# Patient Record
Sex: Male | Born: 1985
Health system: Midwestern US, Community
[De-identification: ages and names within clinical notes are randomized; demographics above are authoritative.]

---

## 2018-07-02 IMAGING — US US GALLBLADDER
1 series · 7 of 7 positions shown · non-contrast
Comparison: None

HISTORY: Epigastric pain
TECHNIQUE: Gallbladder ultrasound.

[Series 1: us gallbladder · 7 of 7 slices shown]
[im 1/7]
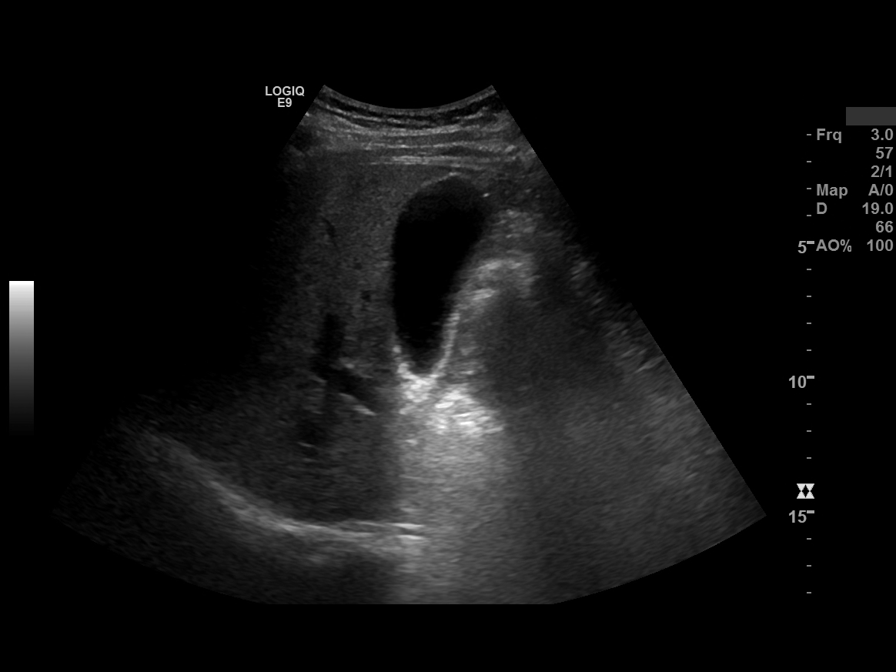
[im 2/7]
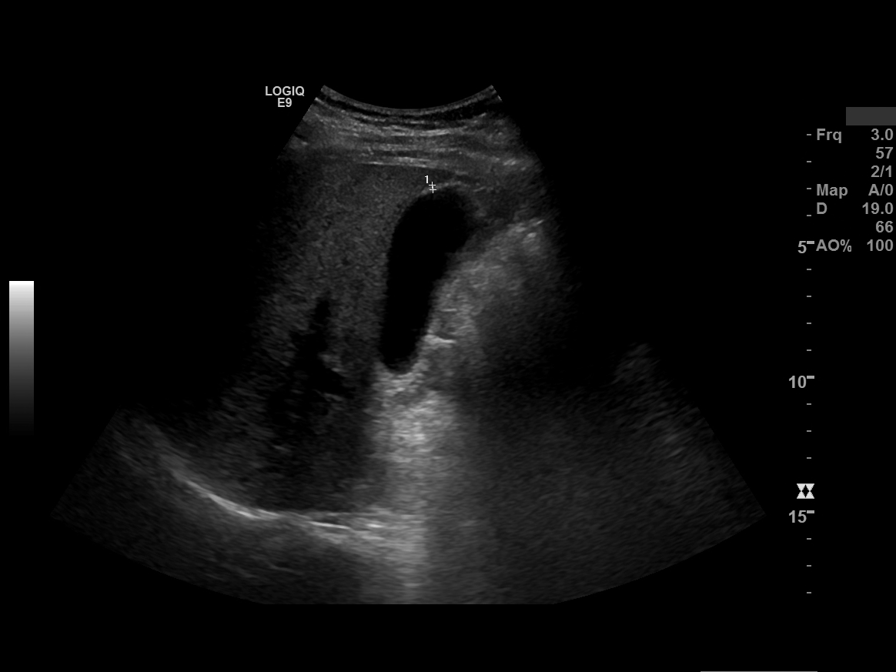
[im 3/7]
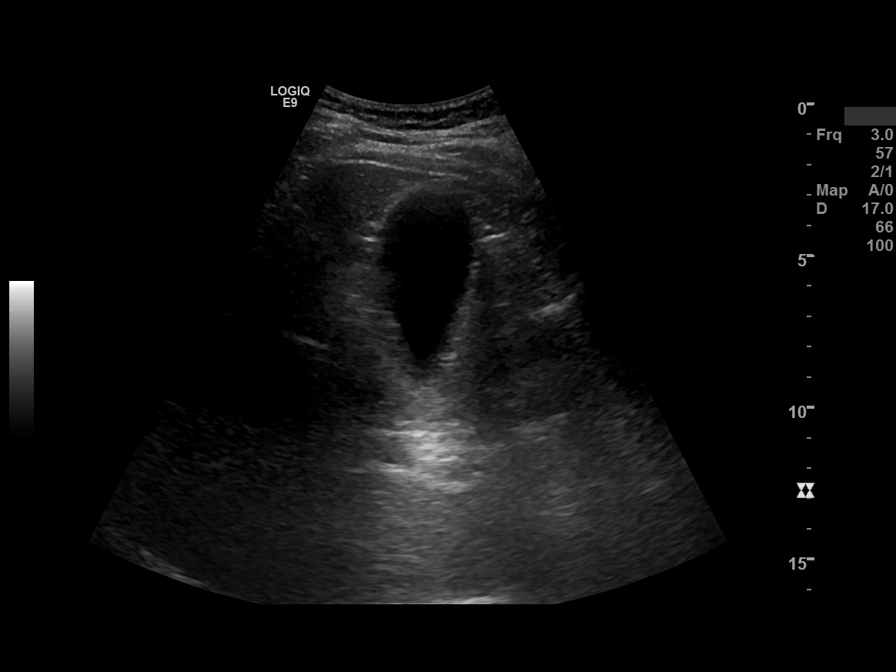
[im 4/7]
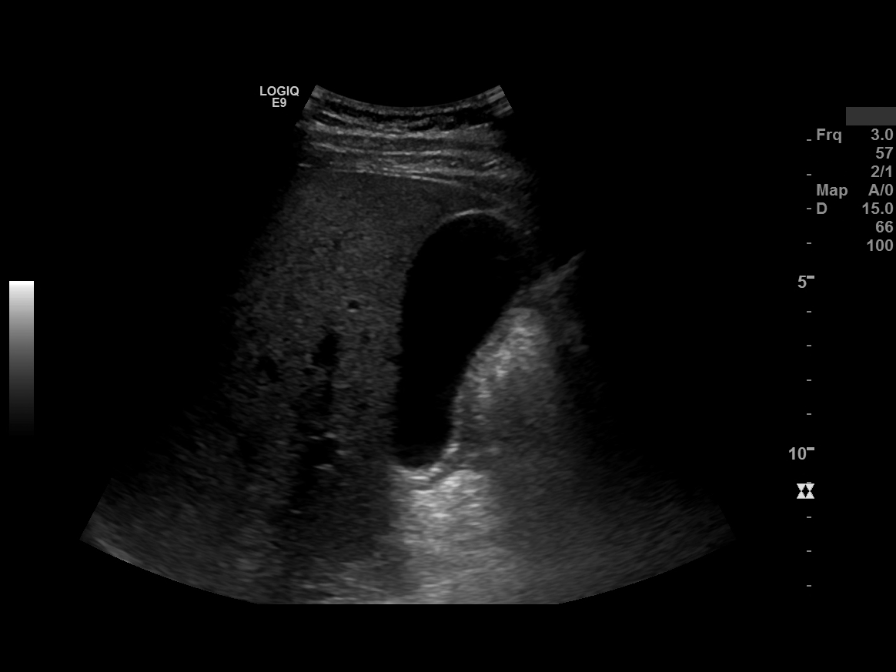
[im 5/7]
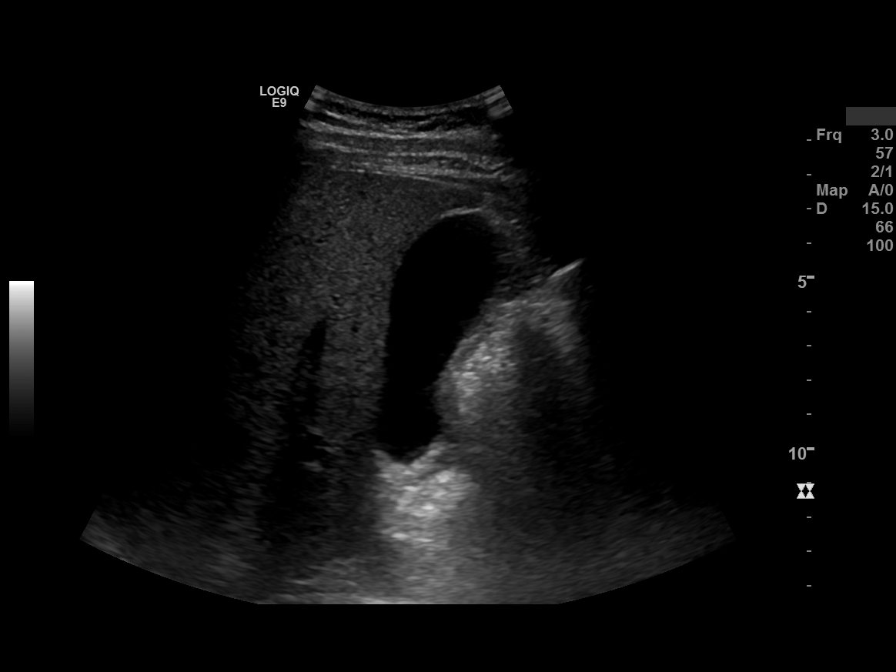
[im 6/7]
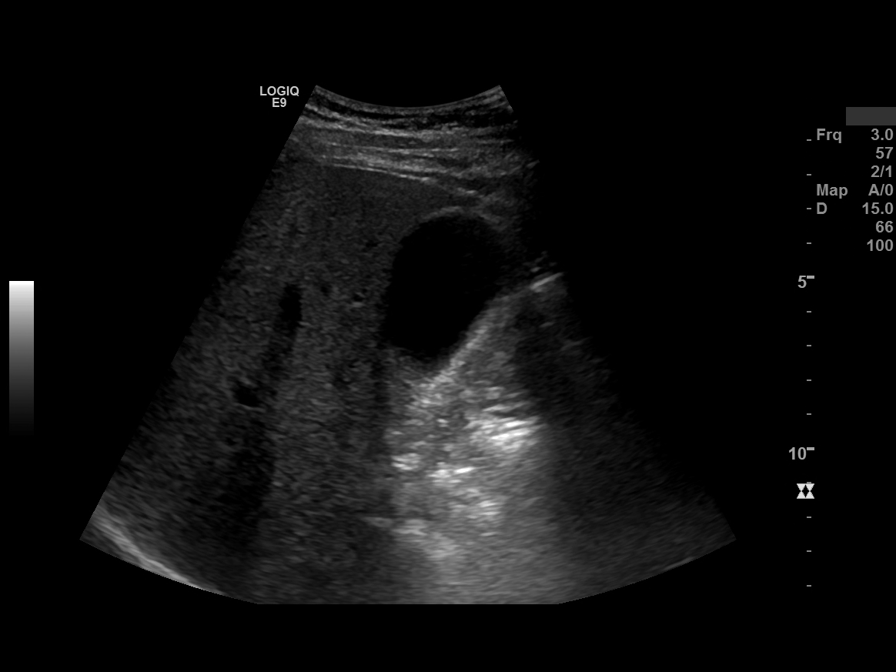
[im 7/7]
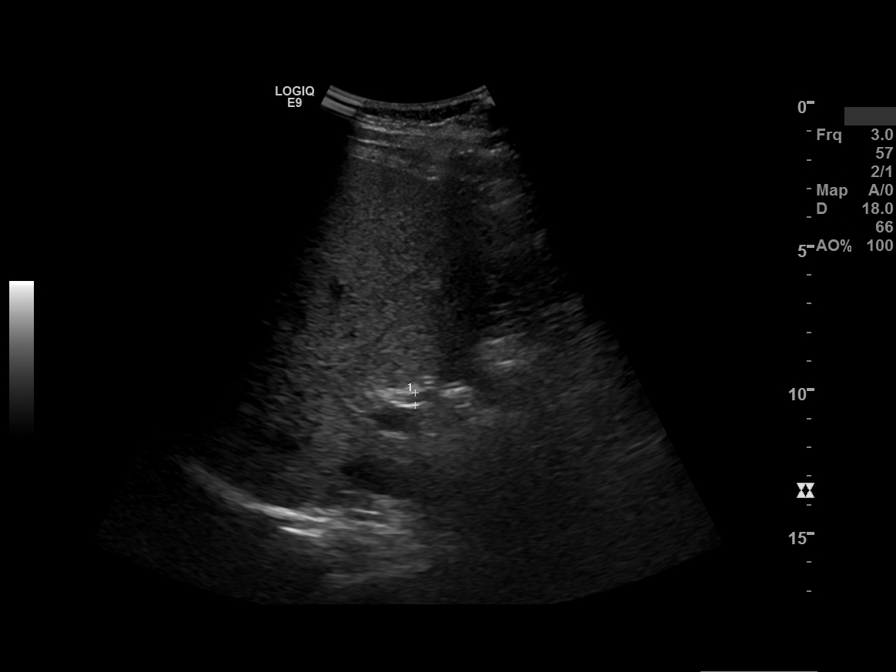

[7 of 7 positions shown; findings below may reference images not displayed]

FINDINGS: Multiple images from real-time examination of the gallbladder were performed.  The gallbladder is normal with no stones, sludge, wall thickening or pericholecystic fluid. Common bile duct is normal in size, measuring 4.2 mm. 

Visualized portions of the liver demonstrates coarsening of hepatic echotexture and increased echogenicity suggesting fatty infiltration.
IMPRESSION: Normal gallbladder ultrasound. Fatty infiltration of the liver.

## 2019-01-19 IMAGING — US US GALLBLADDER
1 series · 11 of 11 positions shown · non-contrast
Comparison: 07/02/2018

HISTORY: Acute pancreatitis
TECHNIQUE: Multiple sonographic images of the gallbladder.

[Series 1: us gallbladder · 11 of 11 slices shown]
[im 1/11]
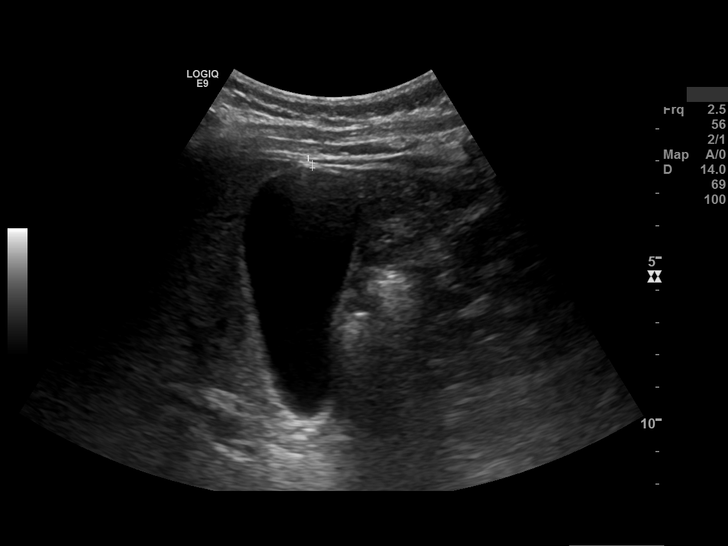
[im 2/11]
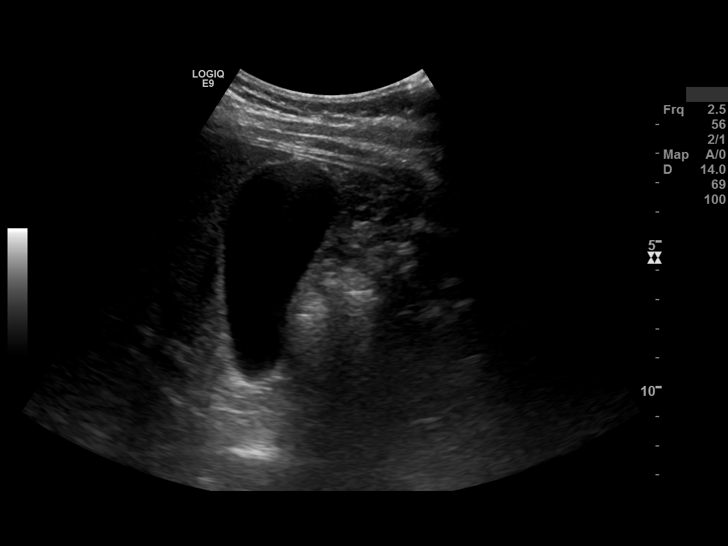
[im 3/11]
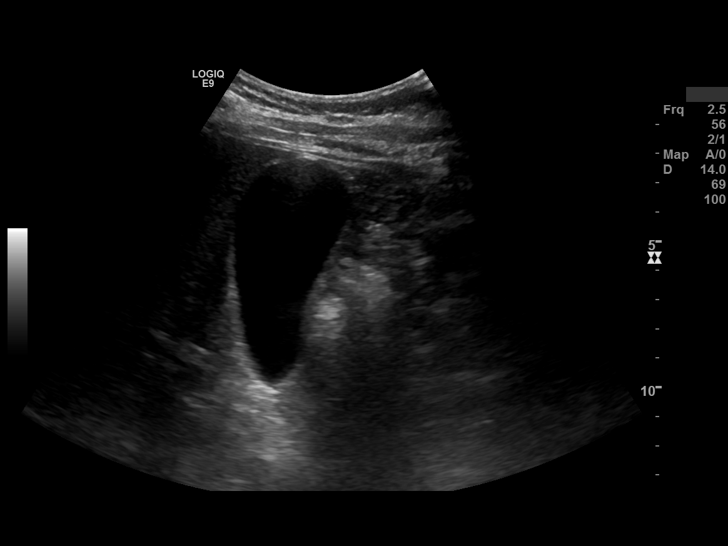
[im 4/11]
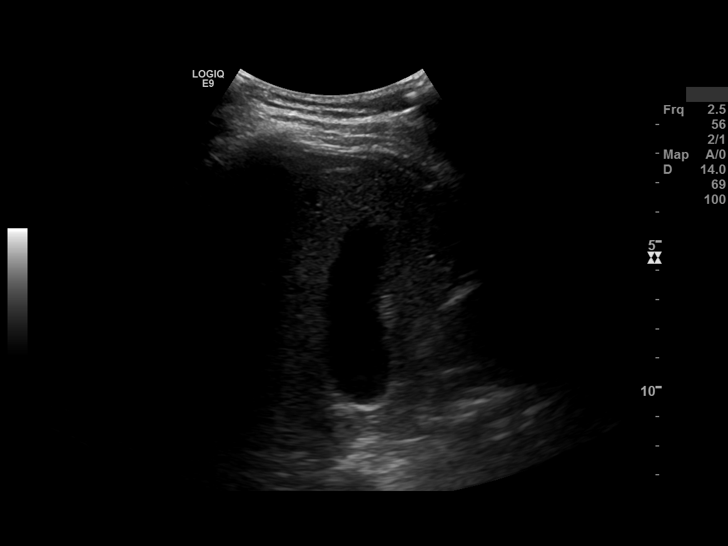
[im 5/11]
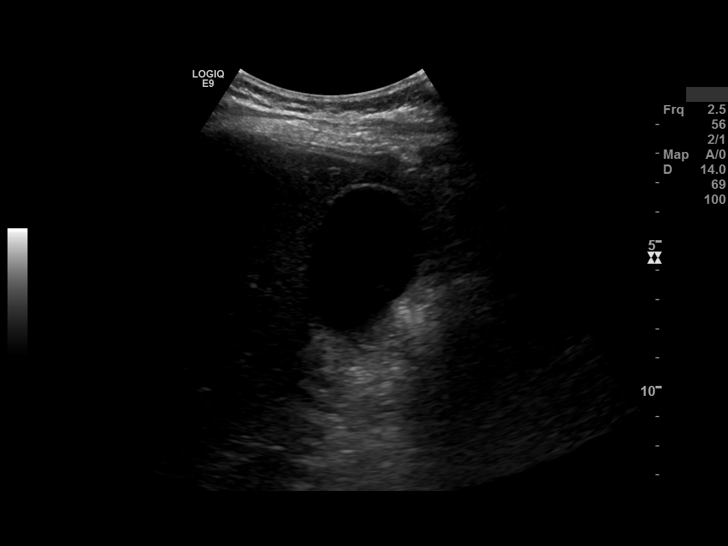
[im 6/11]
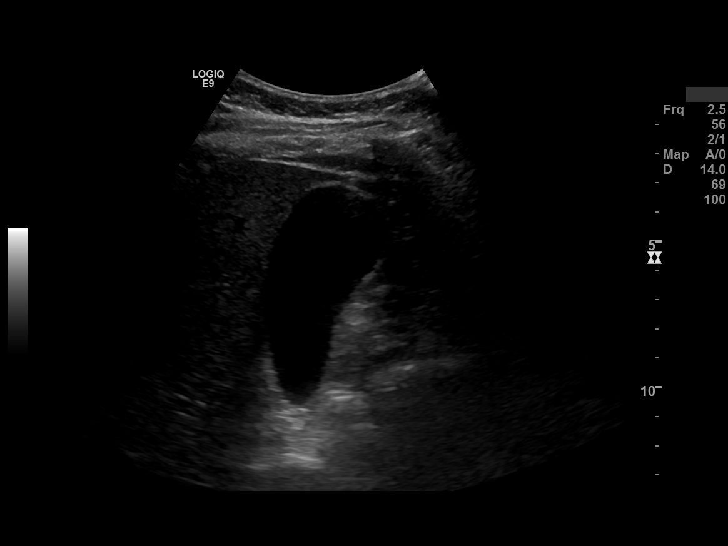
[im 7/11]
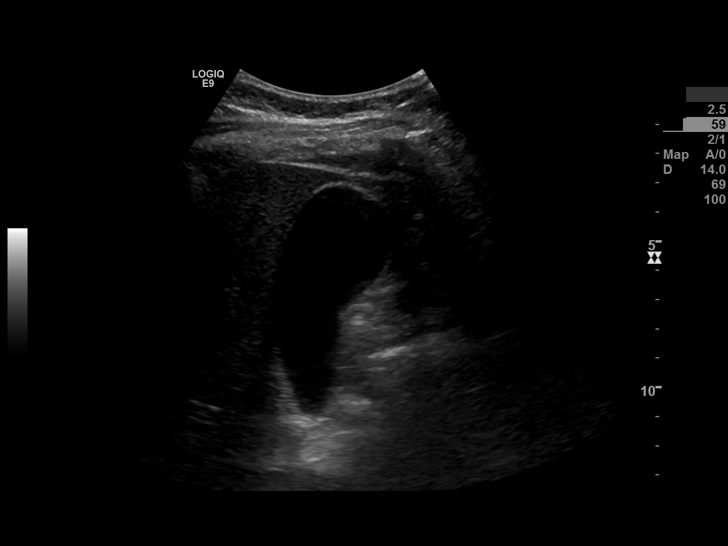
[im 8/11]
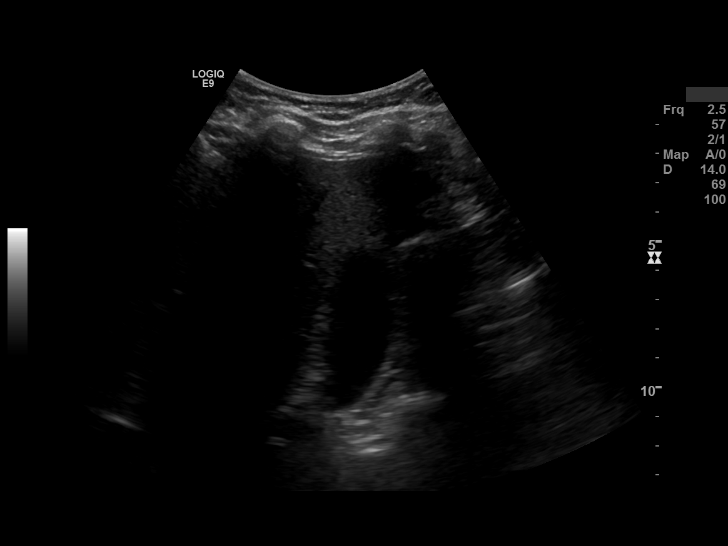
[im 9/11]
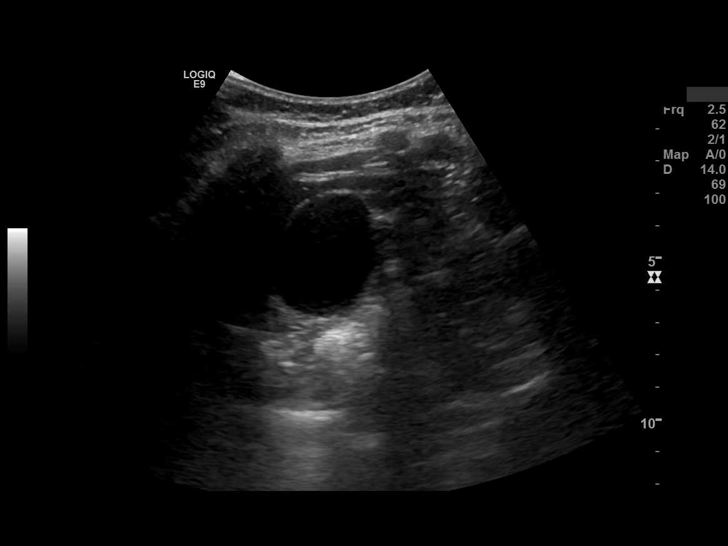
[im 10/11]
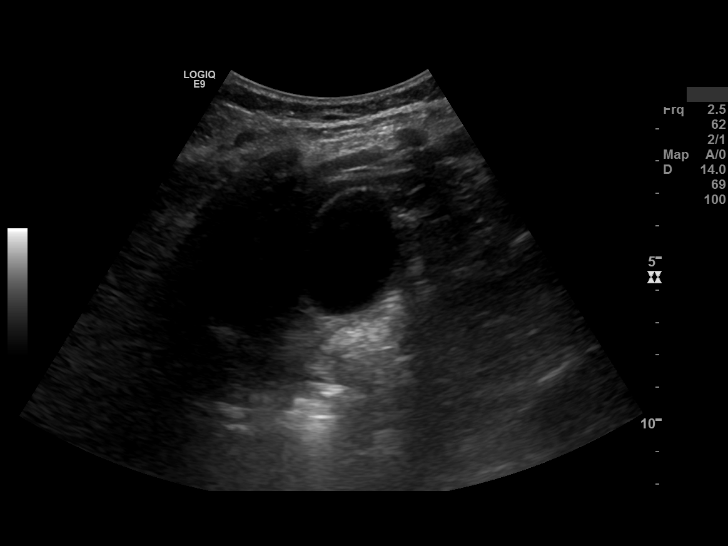
[im 11/11]
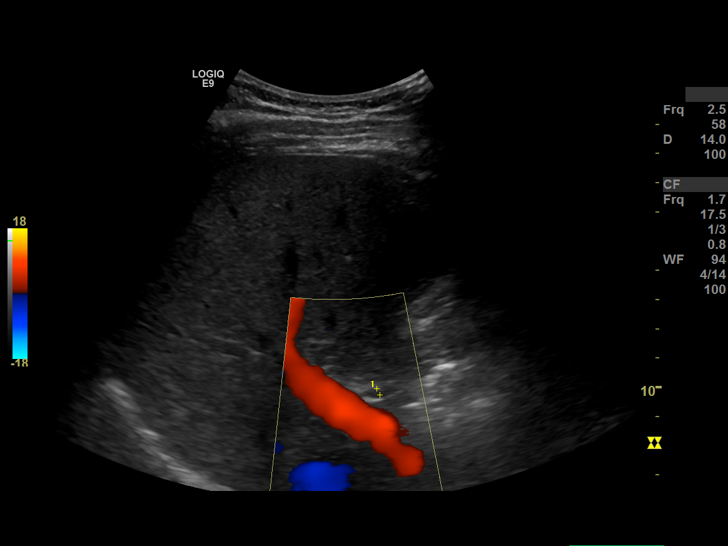

[11 of 11 positions shown; findings below may reference images not displayed]

FINDINGS: No gallbladder wall thickening, cholelithiasis, or pericholecystic fluid.  Common bile duct is within normal limits.
IMPRESSION: Normal gallbladder ultrasound.

## 2020-07-12 IMAGING — MR TECH MRI LSPINE WO CONTRAST
5 series · 48 of 48 positions shown · non-contrast
Comparison: none

HISTORY: Chronic lower back pain 

Tech exam only.

[Series 16: t2_sag · sagittal · 4.0mm · 0.88mm/px · 6 of 19 slices shown]
[im 1/19]
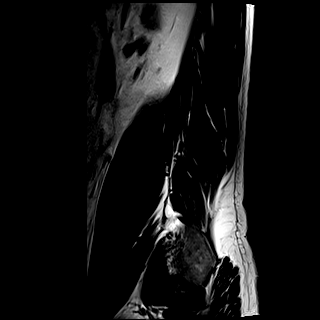
[im 4/19]
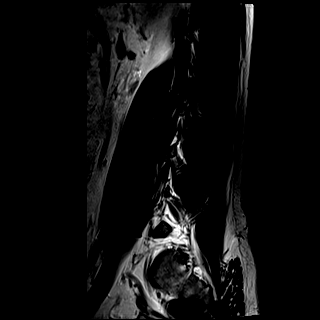
[im 8/19]
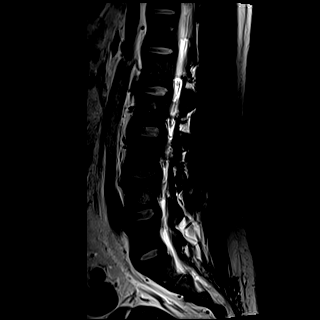
[im 11/19]
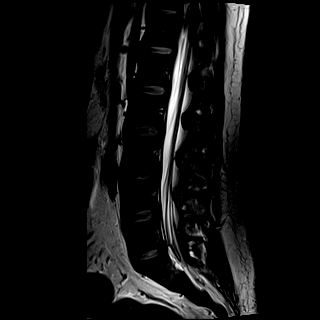
[im 15/19]
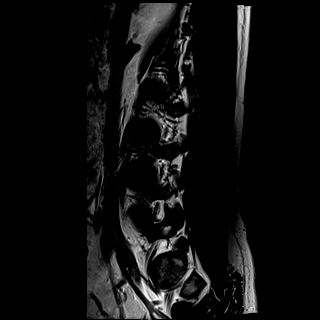
[im 19/19]
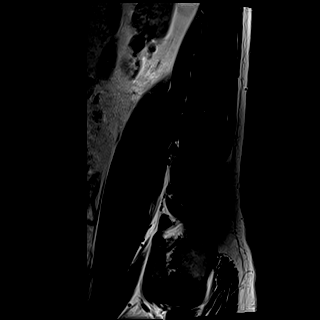

[Series 17: t1_sag · sagittal · 4.0mm · 0.88mm/px · 7 of 19 slices shown]
[im 1/19]
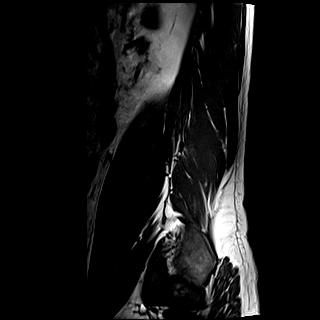
[im 4/19]
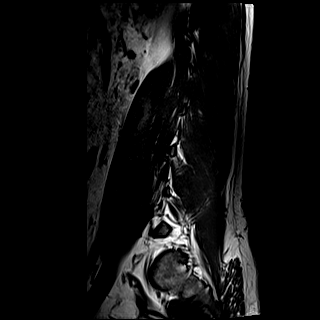
[im 7/19]
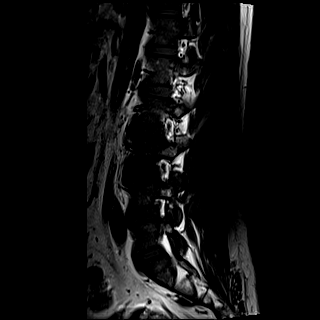
[im 10/19]
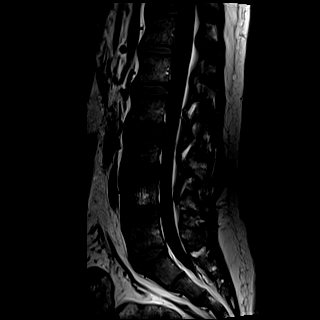
[im 13/19]
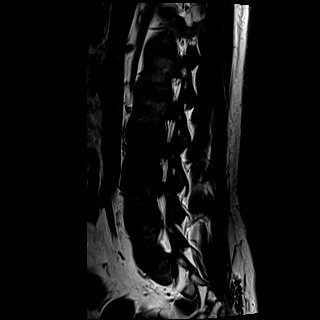
[im 16/19]
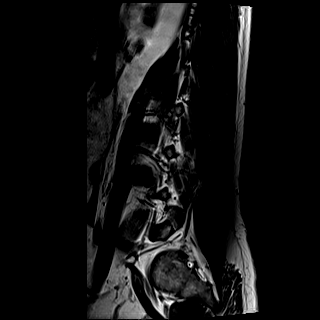
[im 19/19]
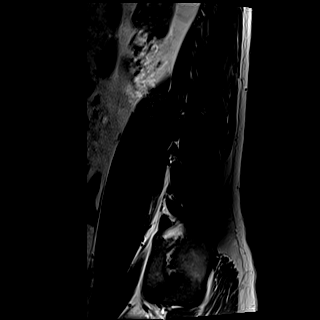

[Series 18: ir_sag · sagittal · 4.0mm · 1.09mm/px · 7 of 19 slices shown]
[im 1/19]
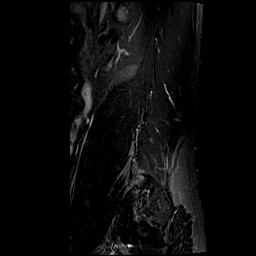
[im 4/19]
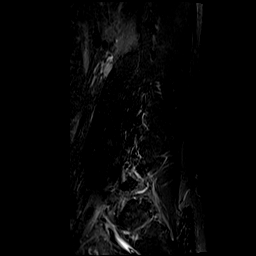
[im 7/19]
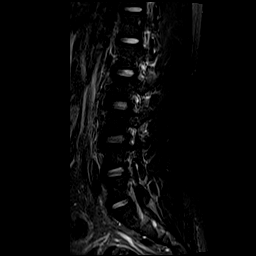
[im 10/19]
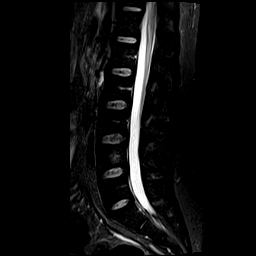
[im 13/19]
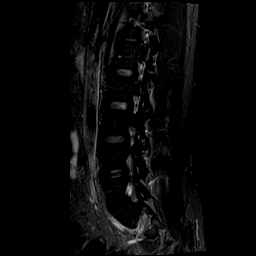
[im 16/19]
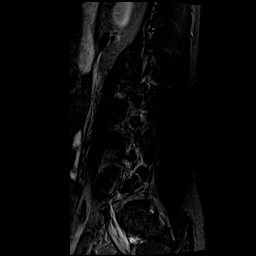
[im 19/19]
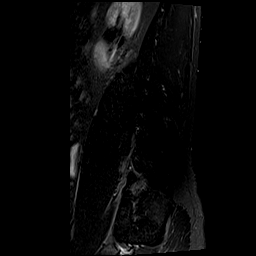

[Series 19: t2_axial · axial · 4.0mm · 0.62mm/px · z∈[-482,-249]mm · 17 of 46 slices shown]
[im 1/46]
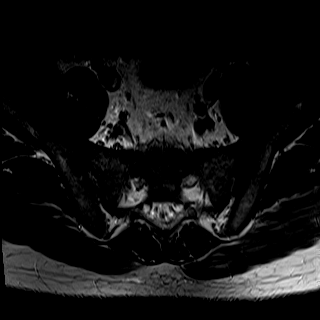
[im 3/46]
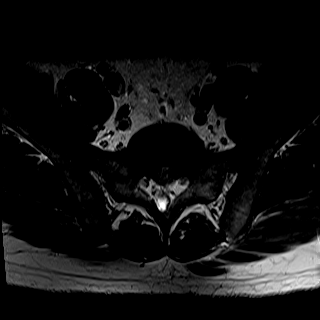
[im 6/46]
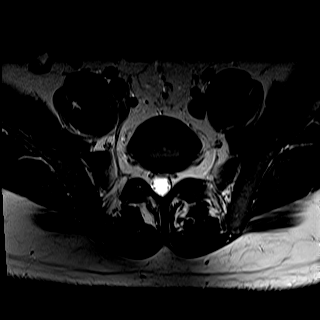
[im 9/46]
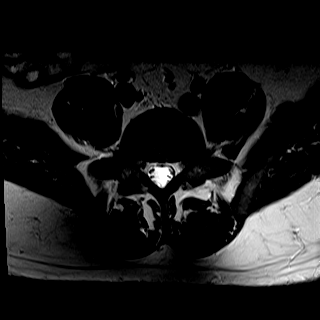
[im 12/46]
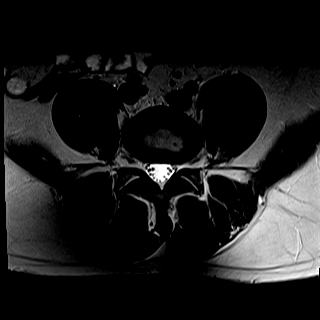
[im 15/46]
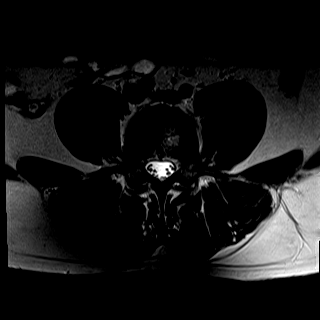
[im 17/46]
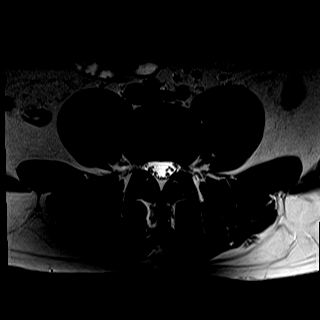
[im 20/46]
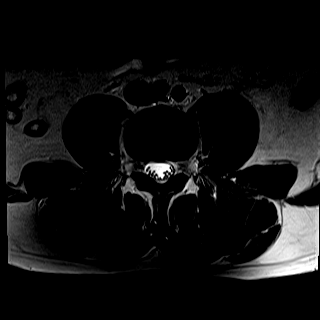
[im 23/46]
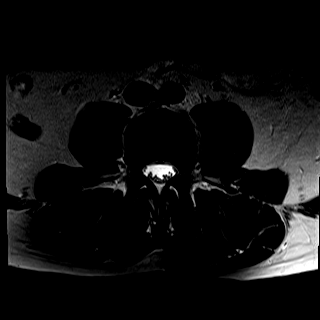
[im 26/46]
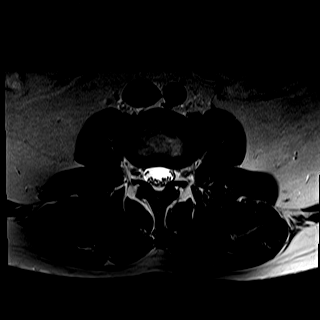
[im 29/46]
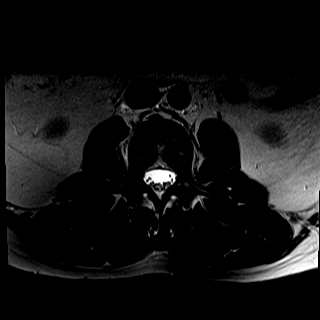
[im 31/46]
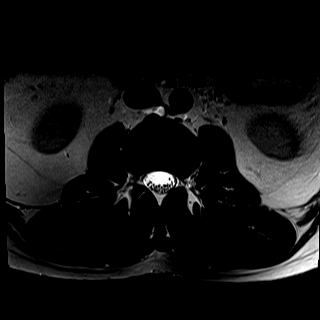
[im 34/46]
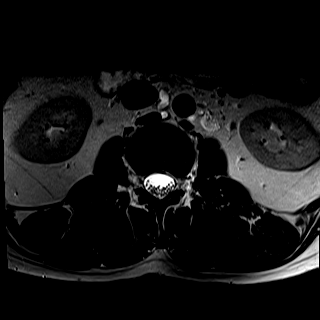
[im 37/46]
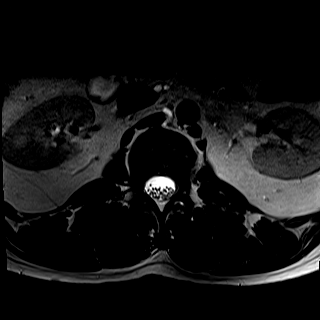
[im 40/46]
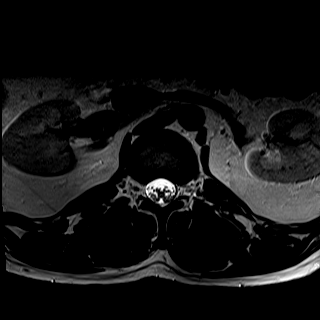
[im 43/46]
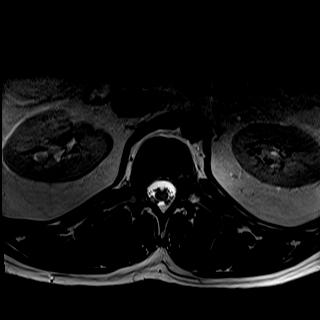
[im 46/46]
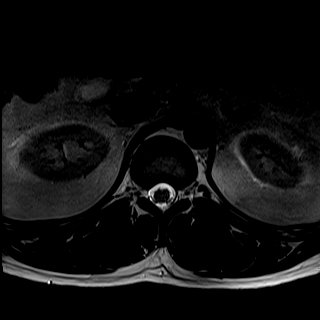

[Series 21: t1_axial_obl · axial · 3.0mm · 0.78mm/px · z∈[-509,-265]mm · 11 of 30 slices shown]
[im 1/30]
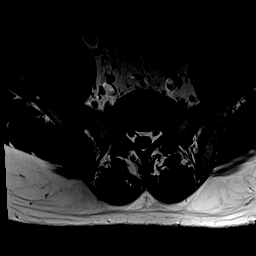
[im 3/30]
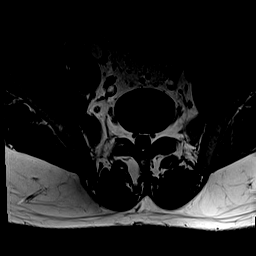
[im 6/30]
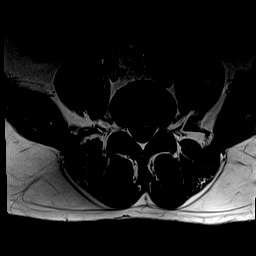
[im 9/30]
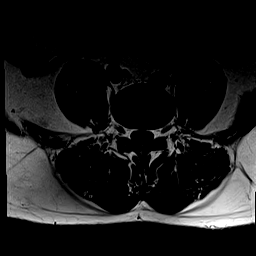
[im 12/30]
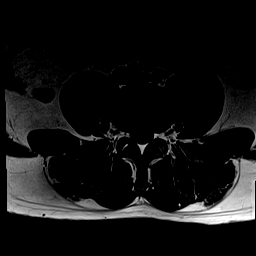
[im 15/30]
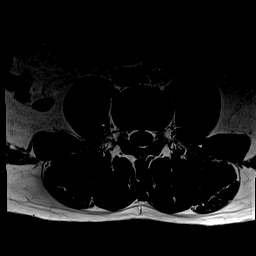
[im 18/30]
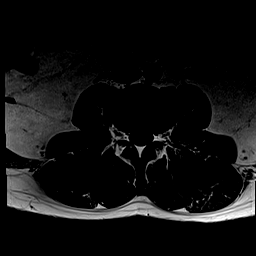
[im 21/30]
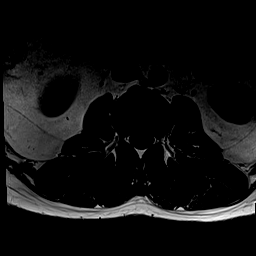
[im 24/30]
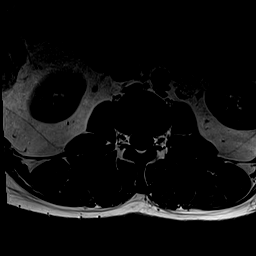
[im 27/30]
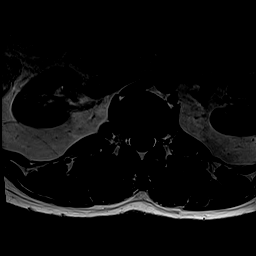
[im 30/30]
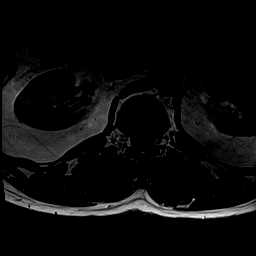

[48 of 48 positions shown; findings below may reference images not displayed]

IMPRESSION: Images not interpreted by [HOSPITAL].

## 2022-01-23 IMAGING — MR MRCP (MR Cholangiogram w/Reconstructions)
10 of 15 series · 22 of 48 positions shown · IV contrast (prohance)
Comparison: Gallbladder ultrasound 01/19/2019

HISTORY: Other chronic pancreatitis
TECHNIQUE: Multiplanar multisequence MR of the abdomen without and with contrast with MRCP sequences was performed. Postprocessing software generated rotated 3D MIP images performed under concurrent physician supervision.

Contrast dose: 20 mL ProHance

[Series 1: true_fisp_loc · axial · 7.0mm · 1.88mm/px · 1 of 19 slices shown]
[im 1/19]
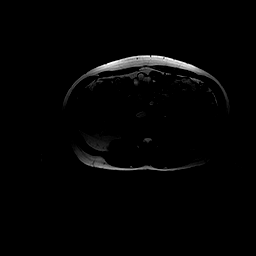

[Series 2: t2_cor · coronal · 6.0mm · 0.66mm/px · 1 of 28 slices shown]
[im 1/28]
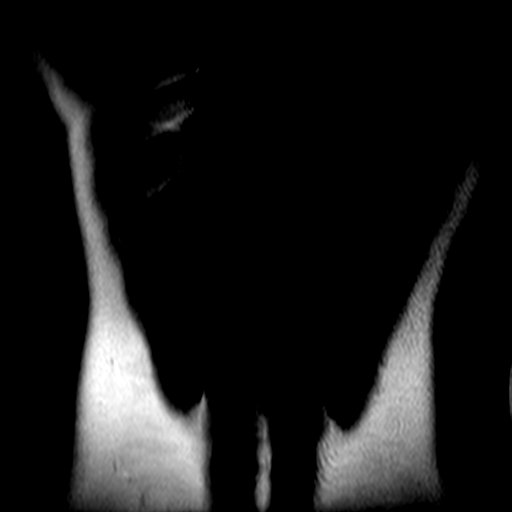

[Series 3: t2_axial _fs_bh · axial · 6.0mm · 0.68mm/px · 1 of 30 slices shown]
[im 1/30]
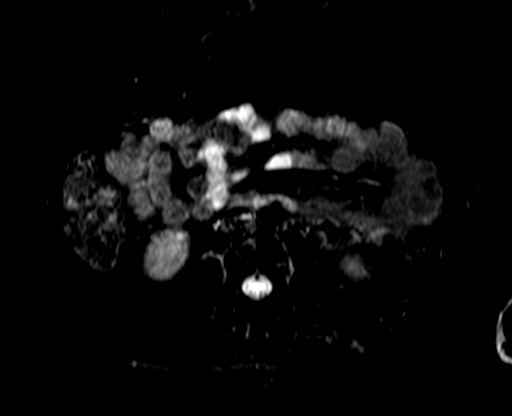

[Series 5: t1_fl2d_opp_ph_(person_name) · axial · 6.0mm · 0.68mm/px · z∈[-43,+174]mm · 2 of 30 slices shown]
[im 1/30]
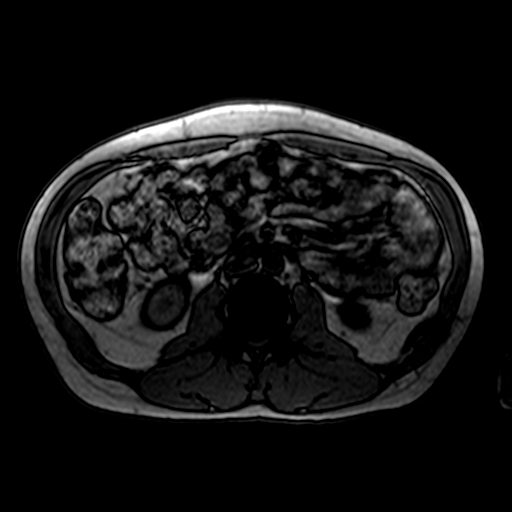
[im 30/30]
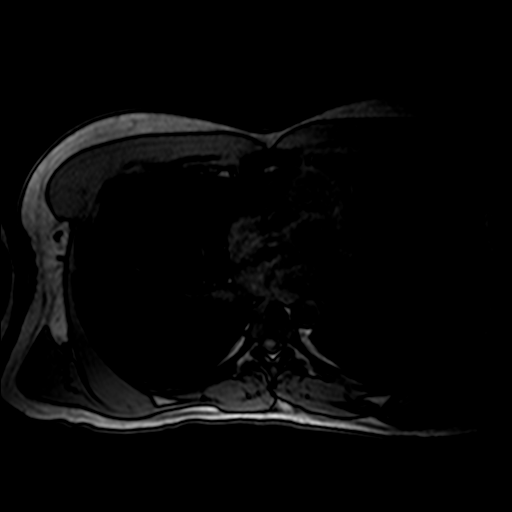

[Series 6: t1_fl2d_in_ph_(person_name) · axial · 6.0mm · 0.68mm/px · z∈[-43,+174]mm · 2 of 30 slices shown]
[im 1/30]
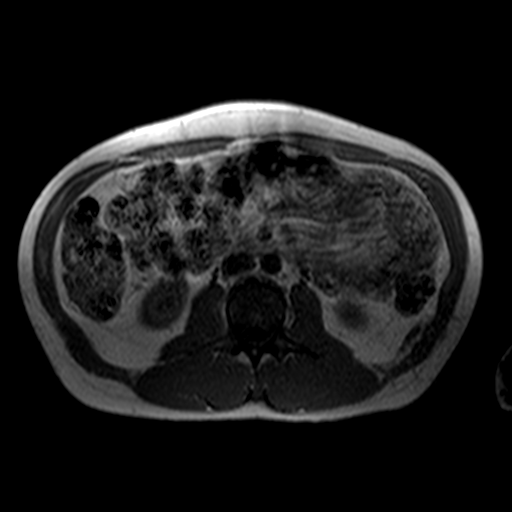
[im 30/30]
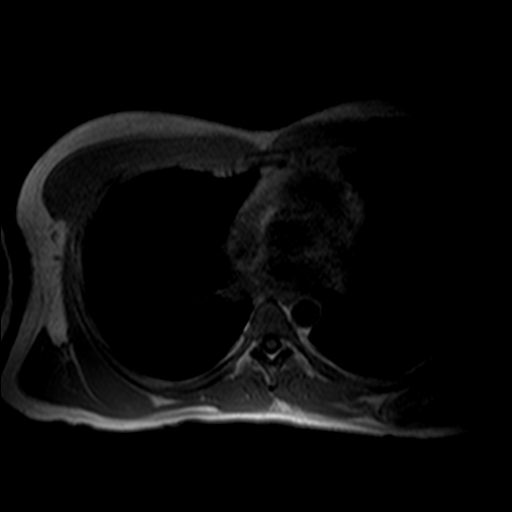

[Series 9: mrcp_t2_3(person_name)_(person_name)_(person_name)_(person_name) · coronal · 1.5mm · 0.83mm/px · 4 of 56 slices shown]
[im 1/56]
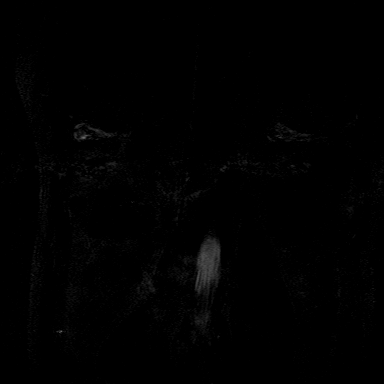
[im 19/56]
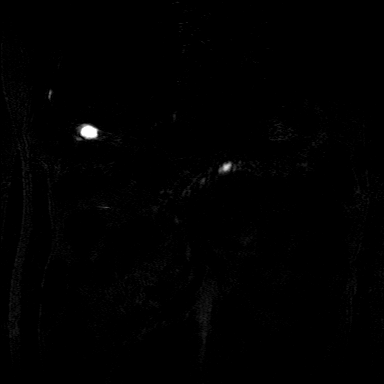
[im 37/56]
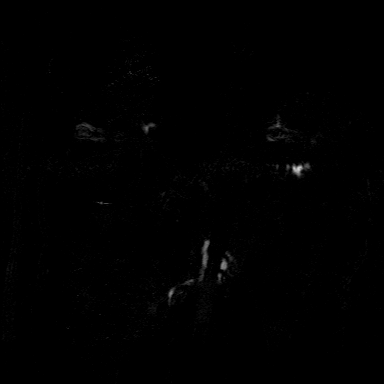
[im 56/56]
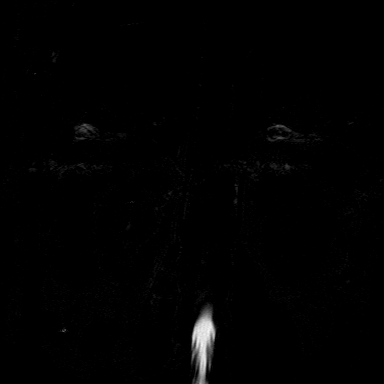

[Series 11: t1_axial_vibe_fs_pre · axial · 3.0mm · 0.68mm/px · z∈[-16,+173]mm · 5 of 64 slices shown]
[im 1/64]
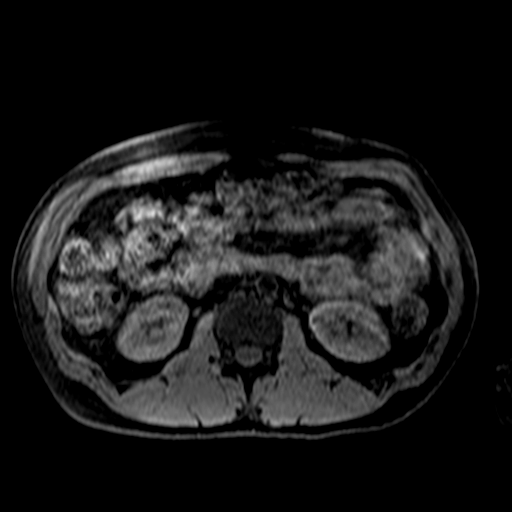
[im 16/64]
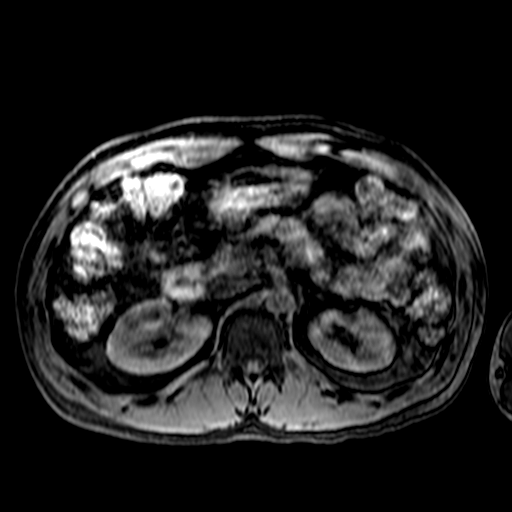
[im 32/64]
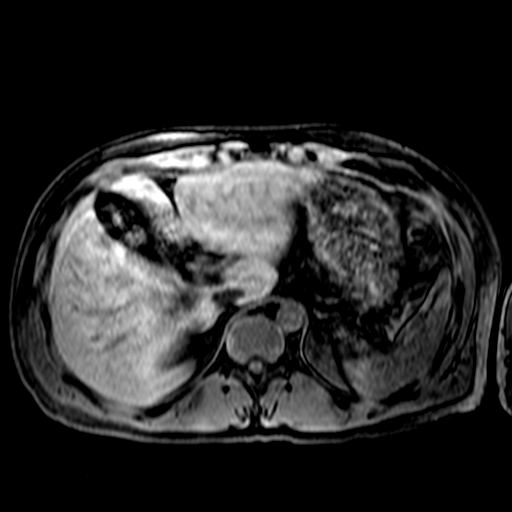
[im 48/64]
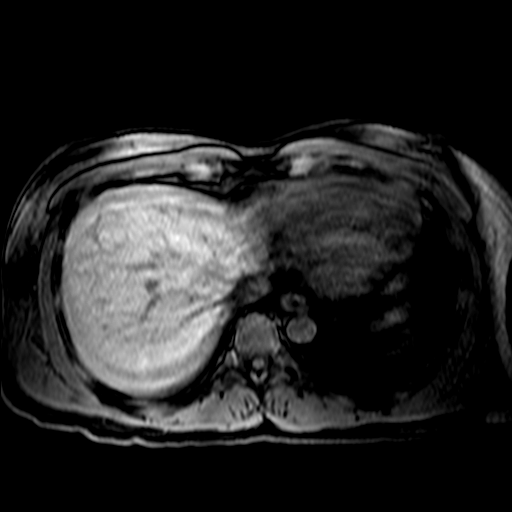
[im 64/64]
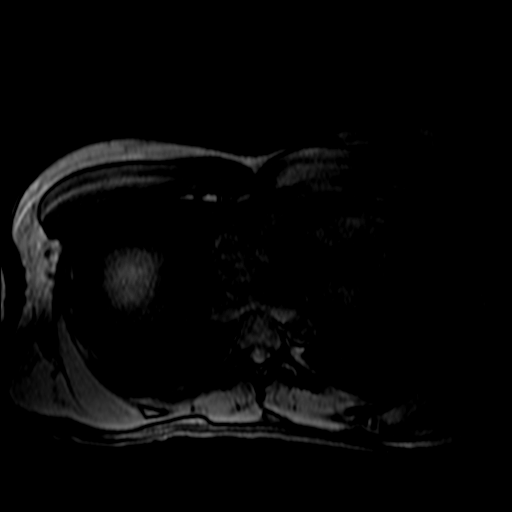

[Series 12: 3d_mrcp_rotation · sagittal · 0.83mm/px · 1 of 13 slices shown]
[im 1/13]
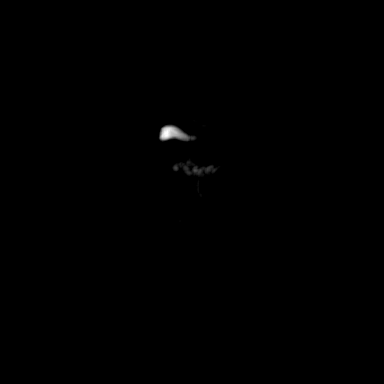

[Series 13: 3d_mrcp_tumble · axial · 0.83mm/px · 1 of 19 slices shown]
[im 1/19]
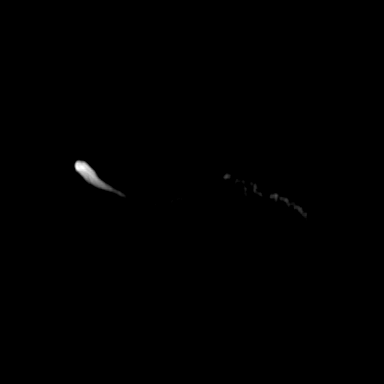

[Series 15: t1_axial_vibe +c1_fs · axial · 3.0mm · 0.68mm/px · z∈[-16,+125]mm · 4 of 64 slices shown]
[im 1/64]
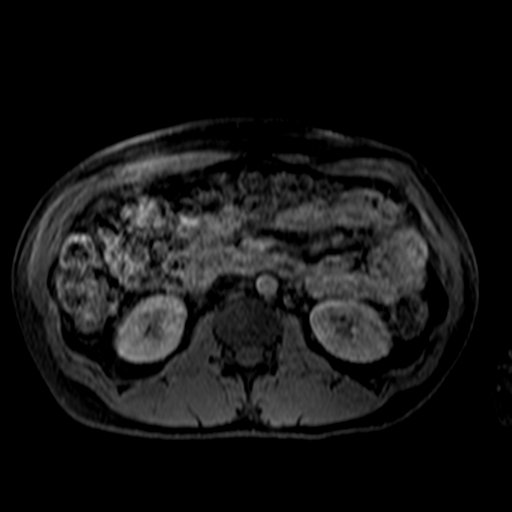
[im 16/64]
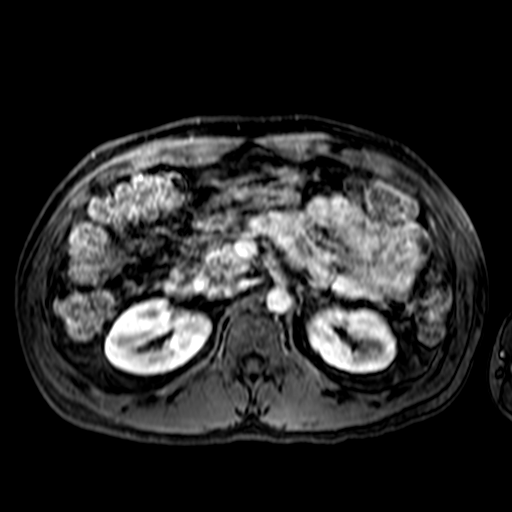
[im 32/64]
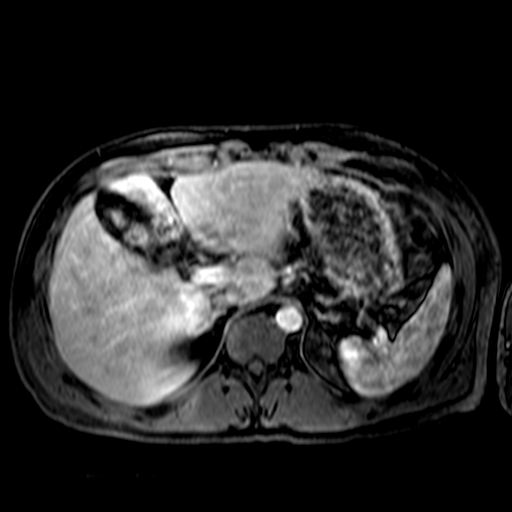
[im 48/64]
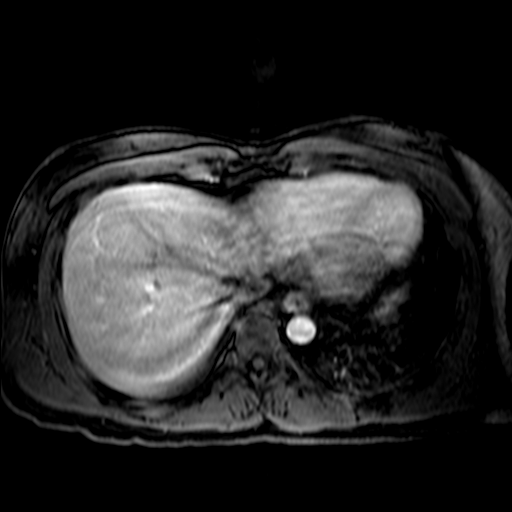

[22 of 48 positions shown; findings below may reference images not displayed]

FINDINGS: MRCP: The gallbladder is relatively contracted. No obvious stones are seen. There is no intrahepatic or extrahepatic dilatation of the bile ducts. The common bile duct measures in the range of 3-4 mm.

There is dilatation of the pancreatic duct and branch ducts in the body and tail of the pancreas primarily with some atrophy of the pancreatic parenchyma in this location. This could relate to sequela of previous pancreatitis. The proximal pancreatic duct in the head is not well seen. Neither is its insertion into the duodenum.

MRI ABDOMEN: The liver has a homogeneous signal without significant hepatic steatosis. No enhancing hepatic lesion is seen. The portal vein is patent. Again the gallbladder is somewhat contracted.

The spleen is normal in size. The splenic vein appears patent. Superior mesenteric vein is patent.

Again there is dilatation of the pancreatic duct and branch ducts in the body and tail of the pancreas with atrophy of the parenchyma in this location. An obvious obstructing lesion is not visualized. Again this could represent sequela of previous pancreatitis as per patient history.

The adrenal glands and kidneys are unremarkable.
IMPRESSION: 1.
 Dilatation of the pancreatic duct and branch ducts in the body and tail of the pancreas with atrophy of the parenchyma in this location could relate to sequela of previous pancreatitis as per patient history.

2.
No obvious obstructing lesion is seen.

3.
Suggest follow-up MRI abdomen with MRCP in 6 months to confirm stability.

4.
No gallstones. Normal caliber common bile duct.

## 2023-05-20 NOTE — Telephone Encounter (Addendum)
Patient needs office appt to  discuss stone treatment and hospital follow up     Patient has [employer] prime and needs a ref, he will call back once he gets it     House, [place], [place]  Barrington Ellison D  Assessment:  5 x 6 mm and 2 mm right proximal ureteral stone  5 x 9 mm left proximal ureteral stone  Mild bilateral hydronephrosis.  Bilateral renal stones              S/p cystoscopy, bilateral retrograde pyelogram and bilateral ureteral stent placement 05/18/23 by Dr. Verdie Mosher              UCx 6/18: NG        AKI- resolved              sCr 0.9<1.8 (unknown baseline)                   Plan:  Interval B/L stent placement  Cont Flomax 0.4mg  daily  Arrangements made for definitive stone surgery in several weeks  Will sign off    - Stone pathway: Intermediate  - Surgery recommended: B/L  Ureteroscopic Stone Extraction with Laser  - Stented: yes ; Reason for stent: Uncontrolled Pain / Renal Failure  - Location of CT: [facility]  - Okay for ASC - yesB/L  - COVID vaccinated: no  - Anticoagulant/antiplatelet to stop - no,  Drug name: none

## 2023-06-21 NOTE — Progress Notes (Signed)
Message  Received: 4 days ago  Janece Canterbury, NP  [facility]  [facility] Follow-up     Patient:     Cameron Morales     04/16/1986     MRN:   [id number]     Brief Summary of Hospitalization: Patient is status post cystoscopy with bilateral ureteral stent placement for bilateral obstructing stone 05/18/2023.  Presented to the ED with gross hematuria and flank pain.  Labs unremarkable, patient not in retention and pain controlled at discharge.  Discharged with oxybutynin, Flomax, Bactrim and pain meds.     Follow-up: As soon as able     Appointment with :  [facility]     Imaging/testing prior to appointment:   None     Foley/stent/PCN in place?  None     Brief Plan for Follow-up: Discuss/schedule stone surgery as soon as able     Functional status/logistical concerns:  Unknown       DEJA K HUNT, NP

## 2023-06-23 NOTE — Telephone Encounter (Signed)
Spoke with patient and he said they referred with to [facility] for treatment
